# Patient Record
Sex: Male | Born: 1970 | Race: White | Hispanic: No | Marital: Single | Smoking: Current every day smoker
Health system: Southern US, Community
[De-identification: ages and names within clinical notes are randomized; demographics above are authoritative.]

## PROBLEM LIST (undated history)

## (undated) HISTORY — PX: ACHILLES TENDON REPAIR: SUR1153

---

## 2016-04-10 ENCOUNTER — Encounter (HOSPITAL_COMMUNITY): Payer: Self-pay | Admitting: Emergency Medicine

## 2016-04-10 ENCOUNTER — Emergency Department (HOSPITAL_COMMUNITY)
Admission: EM | Admit: 2016-04-10 | Discharge: 2016-04-10 | Disposition: A | Discharge status: Home / Self Care | Payer: Self-pay | Attending: Emergency Medicine | Admitting: Emergency Medicine

## 2016-04-10 DIAGNOSIS — M7662 Achilles tendinitis, left leg: Secondary | ICD-10-CM | POA: Insufficient documentation

## 2016-04-10 DIAGNOSIS — F17210 Nicotine dependence, cigarettes, uncomplicated: Secondary | ICD-10-CM | POA: Insufficient documentation

## 2016-04-10 MED ORDER — MELOXICAM 15 MG PO TABS: 15.0000 mg | ORAL_TABLET | Freq: Every day | ORAL | 0 refills | Status: AC

## 2016-04-10 MED ORDER — HYDROCODONE-ACETAMINOPHEN 5-325 MG PO TABS
1.0000 | ORAL_TABLET | Freq: Once | ORAL | Status: AC
  Administered 2016-04-10: 1 via ORAL
  Filled 2016-04-10: qty 1

## 2016-04-10 MED ORDER — NAPROXEN 250 MG PO TABS
500.0000 mg | ORAL_TABLET | Freq: Once | ORAL | Status: AC
  Administered 2016-04-10: 500 mg via ORAL
  Filled 2016-04-10: qty 2

## 2016-04-10 MED ORDER — CYCLOBENZAPRINE HCL 10 MG PO TABS: 10.0000 mg | ORAL_TABLET | Freq: Two times a day (BID) | ORAL | 0 refills | Status: AC | PRN

## 2016-04-10 MED ORDER — PREDNISONE 10 MG (21) PO TBPK: 10.0000 mg | ORAL_TABLET | Freq: Every day | ORAL | 0 refills | Status: AC

## 2016-04-10 MED ORDER — HYDROCODONE-ACETAMINOPHEN 5-325 MG PO TABS: 1.0000 | ORAL_TABLET | Freq: Four times a day (QID) | ORAL | 0 refills | Status: AC | PRN

## 2016-04-10 NOTE — ED Notes (Signed)
Pt is in stable condition upon d/c and ambulates from ED. 

## 2016-04-10 NOTE — ED Triage Notes (Signed)
Pt from home with c/o left ankle pain starting this past week.  Pt reports hx of previous injury.  NAD, A&O.

## 2016-04-10 NOTE — Discharge Instructions (Signed)
Below are instructions for performing the Achilles Tendon Taping. Use athletic or kinesiology tape for the best support.    Take the Mobic daily for the next 7 days. Make sure the Achilles tendon has support at all times, especially while ambulating. Follow-up with orthopedics.

## 2016-04-10 NOTE — ED Provider Notes (Signed)
[facility]-EMERGENCY DEPT Provider Note   CSN: [id number] Arrival date & time: 04/10/16  1434  By signing my name below, I, [name], attest that this documentation has been prepared under the direction and in the presence of non-physician practitioner, Harolyn Rutherford, PA-C. Electronically Signed: [name], Scribe. 04/10/2016. 2:56 PM.  History   Chief Complaint Chief Complaint  Patient presents with  . Ankle Pain   The history is provided by the patient. No language interpreter was used.   HPI Comments: Henry Finley is a 45 y.o. male who presents to the Emergency Department complaining of constant moderate left ankle pain that started last week. He states that his pain may be related to a past injury that caused an Achilles tendon rupture requiring surgery 3-4 years ago, and reports no new injury. Pt describes the pain as constant, throbbing, radiating to his left posterior ankle, 8/10 in severity, exacerbated by ambulation, relieved by an ankle brace, and unrelieved by OTC pain medication. He denies any neuro deficits or any other complaints.   History reviewed. No pertinent past medical history.  There are no active problems to display for this patient.   Past Surgical History:  Procedure Laterality Date  . ACHILLES TENDON REPAIR     left       Home Medications    Prior to Admission medications   Medication Sig Start Date End Date Taking? Authorizing Provider  cyclobenzaprine (FLEXERIL) 10 MG tablet Take 1 tablet (10 mg total) by mouth 2 (two) times daily as needed for muscle spasms. 04/10/16   Shawn C Joy, PA-C  HYDROcodone-acetaminophen (NORCO/VICODIN) 5-325 MG tablet Take 1-2 tablets by mouth every 6 (six) hours as needed for severe pain. 04/10/16   Shawn C Joy, PA-C  meloxicam (MOBIC) 15 MG tablet Take 1 tablet (15 mg total) by mouth daily. 04/10/16   Shawn C Joy, PA-C  predniSONE (STERAPRED UNI-PAK 21 TAB) 10 MG (21) TBPK tablet Take 1 tablet (10 mg total) by mouth daily.  Take 6 tabs by mouth daily  for 2 day, then 5 tabs for 2 days, then 4 tabs for 2 days, then 3 tabs for 2 days, 2 tabs for 2 days, then 1 tab by mouth daily for 2 days 04/10/16   Anselm Pancoast, PA-C    Family History History reviewed. No pertinent family history.  Social History Social History  Substance Use Topics  . Smoking status: Current Every Day Smoker    Packs/day: 0.50    Types: Cigarettes  . Smokeless tobacco: Never Used  . Alcohol use No     Allergies   Review of patient's allergies indicates no known allergies.   Review of Systems Review of Systems  Musculoskeletal: Positive for arthralgias and myalgias.  Neurological: Negative for weakness and numbness.     Physical Exam Updated Vital Signs BP 123/87 (BP Location: Left Arm)   Pulse 71   Temp 98.3 F (36.8 C) (Oral)   Resp 16   SpO2 97%   Physical Exam  Constitutional: He appears well-developed and well-nourished. No distress.  HENT:  Head: Normocephalic and atraumatic.  Eyes: Conjunctivae are normal.  Neck: Neck supple.  Cardiovascular: Normal rate and regular rhythm.   Pulmonary/Chest: Effort normal.  Musculoskeletal:  TTP along left achilles tendon, with full ROM in the left ankle. Thompson's test shows the Achilles tendon is intact. Strength is 5/5. No sensory deficits.   Neurological: He is alert.  Skin: Skin is warm and dry. He is not diaphoretic.  Psychiatric: He has a normal mood and affect. His behavior is normal.  Nursing note and vitals reviewed.    ED Treatments / Results  DIAGNOSTIC STUDIES: Oxygen Saturation is 97% on RA, normal by my interpretation.    COORDINATION OF CARE: 3:00 PM- Pt advised of plan for treatment and pt agrees.  Labs (all labs ordered are listed, but only abnormal results are displayed) Labs Reviewed - No data to display  EKG  EKG Interpretation None       Radiology No results found.  Procedures Procedures (including critical care time)  Orders  Placed This Encounter  Procedures  . Apply ace wrap    Medications Ordered in ED Medications  naproxen (NAPROSYN) tablet 500 mg (not administered)  HYDROcodone-acetaminophen (NORCO/VICODIN) 5-325 MG per tablet 1 tablet (not administered)     Initial Impression / Assessment and Plan / ED Course  I have reviewed the triage vital signs and the nursing notes.  Pertinent labs & imaging results that were available during my care of the patient were reviewed by me and considered in my medical decision making (see chart for details).  Clinical Course    Henry Finley presents with left posterior ankle pain. Suspect Achilles tendinitis. Patient to follow-up with orthopedics. Resources given. The patient was given instructions for home care as well as return precautions. Patient voices understanding of these instructions, accepts the plan, and is comfortable with discharge.  Final Clinical Impressions(s) / ED Diagnoses   Final diagnoses:  Achilles tendinitis, left leg    New Prescriptions New Prescriptions   CYCLOBENZAPRINE (FLEXERIL) 10 MG TABLET    Take 1 tablet (10 mg total) by mouth 2 (two) times daily as needed for muscle spasms.   HYDROCODONE-ACETAMINOPHEN (NORCO/VICODIN) 5-325 MG TABLET    Take 1-2 tablets by mouth every 6 (six) hours as needed for severe pain.   MELOXICAM (MOBIC) 15 MG TABLET    Take 1 tablet (15 mg total) by mouth daily.   PREDNISONE (STERAPRED UNI-PAK 21 TAB) 10 MG (21) TBPK TABLET    Take 1 tablet (10 mg total) by mouth daily. Take 6 tabs by mouth daily  for 2 day, then 5 tabs for 2 days, then 4 tabs for 2 days, then 3 tabs for 2 days, 2 tabs for 2 days, then 1 tab by mouth daily for 2 days   I personally performed the services described in this documentation, which was scribed in my presence. The recorded information has been reviewed and is accurate.    Anselm PancoastShawn C Joy, PA-C 04/10/16 1538    Geoffery Lyonsouglas Delo, MD 04/11/16 [phone]

## 2016-08-19 ENCOUNTER — Encounter (HOSPITAL_COMMUNITY): Payer: Self-pay | Admitting: Emergency Medicine

## 2016-08-19 ENCOUNTER — Emergency Department (HOSPITAL_COMMUNITY): Payer: Self-pay

## 2016-08-19 ENCOUNTER — Emergency Department (HOSPITAL_COMMUNITY)
Admission: EM | Admit: 2016-08-19 | Discharge: 2016-08-19 | Disposition: A | Discharge status: Home / Self Care | Payer: Self-pay | Attending: Emergency Medicine | Admitting: Emergency Medicine

## 2016-08-19 DIAGNOSIS — F17210 Nicotine dependence, cigarettes, uncomplicated: Secondary | ICD-10-CM | POA: Insufficient documentation

## 2016-08-19 DIAGNOSIS — R509 Fever, unspecified: Secondary | ICD-10-CM

## 2016-08-19 DIAGNOSIS — R05 Cough: Secondary | ICD-10-CM | POA: Insufficient documentation

## 2016-08-19 DIAGNOSIS — R0981 Nasal congestion: Secondary | ICD-10-CM

## 2016-08-19 DIAGNOSIS — R52 Pain, unspecified: Secondary | ICD-10-CM

## 2016-08-19 DIAGNOSIS — R059 Cough, unspecified: Secondary | ICD-10-CM

## 2016-08-19 MED ORDER — GUAIFENESIN 100 MG/5ML PO LIQD: 100.0000 mg | ORAL | 0 refills | Status: AC | PRN

## 2016-08-19 MED ORDER — GUAIFENESIN 100 MG/5ML PO LIQD: 100.0000 mg | ORAL | 0 refills | Status: DC | PRN

## 2016-08-19 MED ORDER — FLUTICASONE PROPIONATE 50 MCG/ACT NA SUSP: 2.0000 | Freq: Every day | NASAL | 2 refills | Status: DC

## 2016-08-19 MED ORDER — FLUTICASONE PROPIONATE 50 MCG/ACT NA SUSP: 2.0000 | Freq: Every day | NASAL | 2 refills | Status: AC

## 2016-08-19 MED ORDER — OSELTAMIVIR PHOSPHATE 75 MG PO CAPS: 75.0000 mg | ORAL_CAPSULE | Freq: Two times a day (BID) | ORAL | 0 refills | Status: AC

## 2016-08-19 MED ORDER — OSELTAMIVIR PHOSPHATE 75 MG PO CAPS: 75.0000 mg | ORAL_CAPSULE | Freq: Two times a day (BID) | ORAL | 0 refills | Status: DC

## 2016-08-19 NOTE — ED Triage Notes (Signed)
Cough and back pain since sat , sputum has some blood streaks in it

## 2016-08-19 NOTE — Discharge Instructions (Signed)
Your chest x-ray was negative today. Your symptoms are most likely due to an upper respiratory infection.  Possibly the flu or bronchitis. You have been prescribed Tamiflu for influenza, please take this as prescribed. You have also been prescribed Flonase nasal spray for nasal congestion and Robitussin for cough. Make sure he stay well-hydrated and drink enough water and that good-quality arrest. Monitoring her symptoms as they should start to improve within 5-7 days. Return to the emergency department if you have difficulty breathing, shortness of breath, chest pain, fevers and or if her symptoms worsen.

## 2016-08-19 NOTE — ED Provider Notes (Signed)
[facility] Provider Note    By signing my name below, I, Earmon Phoenix, attest that this documentation has been prepared under the direction and in the presence of Sharen Heck, PA-C. Electronically Signed: Earmon Phoenix, ED Scribe. 08/19/16. 1:52 PM.    History   Chief Complaint Chief Complaint  Patient presents with  . Cough  . Back Pain   The history is provided by the patient and medical records. No language interpreter was used.    Henry Finley is a 46 y.o. male who presents to the Emergency Department complaining of productive cough of phlegm with streaks of blood that began two days ago. He reports associated generalized body aches, sneezing, nasal congestion, shortness of breath, back and chest soreness with coughing. He reports subjective fever last night. He has taken OTC medication (nyquil) for symptomatic relief. He states he has been eating normally and drinking lots of fluids. Coughing increases his body aches, back pain and chest soreness. He denies alleviating factors. He denies abdominal pain, nausea, vomiting, diarrhea, constipation, exertional CP, otalgia, sore throat. He denies h/o allergies. He denies trauma, injury or fall. He is a smoker stating he smokes about 0.25 PPD. No recent international travel or exposure to TB. No exertional chest pain or chest tightness.  History reviewed. No pertinent past medical history.  There are no active problems to display for this patient.   Past Surgical History:  Procedure Laterality Date  . ACHILLES TENDON REPAIR     left       Home Medications    Prior to Admission medications   Medication Sig Start Date End Date Taking? Authorizing Provider  cyclobenzaprine (FLEXERIL) 10 MG tablet Take 1 tablet (10 mg total) by mouth 2 (two) times daily as needed for muscle spasms. 04/10/16   Shawn C Joy, PA-C  fluticasone (FLONASE) 50 MCG/ACT nasal spray Place 2 sprays into both nostrils daily. 08/19/16   Liberty Handy, PA-C  guaiFENesin (ROBITUSSIN) 100 MG/5ML liquid Take 5-10 mLs (100-200 mg total) by mouth every 4 (four) hours as needed for cough. 08/19/16   Liberty Handy, PA-C  HYDROcodone-acetaminophen (NORCO/VICODIN) 5-325 MG tablet Take 1-2 tablets by mouth every 6 (six) hours as needed for severe pain. 04/10/16   Shawn C Joy, PA-C  meloxicam (MOBIC) 15 MG tablet Take 1 tablet (15 mg total) by mouth daily. 04/10/16   Shawn C Joy, PA-C  oseltamivir (TAMIFLU) 75 MG capsule Take 1 capsule (75 mg total) by mouth every 12 (twelve) hours. 08/19/16   Liberty Handy, PA-C  predniSONE (STERAPRED UNI-PAK 21 TAB) 10 MG (21) TBPK tablet Take 1 tablet (10 mg total) by mouth daily. Take 6 tabs by mouth daily  for 2 day, then 5 tabs for 2 days, then 4 tabs for 2 days, then 3 tabs for 2 days, 2 tabs for 2 days, then 1 tab by mouth daily for 2 days 04/10/16   Anselm Pancoast, PA-C    Family History No family history on file.  Social History Social History  Substance Use Topics  . Smoking status: Current Every Day Smoker    Packs/day: 0.50    Types: Cigarettes  . Smokeless tobacco: Never Used  . Alcohol use No     Allergies   Patient has no known allergies.   Review of Systems Review of Systems  Constitutional: Positive for fever. Negative for chills.  HENT: Positive for congestion, postnasal drip and rhinorrhea. Negative for ear pain, sinus pain, sinus pressure and  sore throat.   Eyes: Negative for visual disturbance.  Respiratory: Positive for cough and shortness of breath. Negative for chest tightness.   Cardiovascular: Negative for chest pain.  Gastrointestinal: Negative for abdominal pain, constipation, diarrhea, nausea and vomiting.  Genitourinary: Negative for decreased urine volume and difficulty urinating.  Musculoskeletal: Positive for myalgias. Negative for arthralgias and joint swelling.  Skin: Negative for rash.  Neurological: Negative for dizziness, light-headedness and headaches.      Physical Exam Updated Vital Signs BP 119/92 (BP Location: Left Arm)   Pulse 78   Temp 99.2 F (37.3 C) (Oral)   Resp 16   Ht 6' (1.829 m)   Wt 156 lb 7 oz (71 kg)   SpO2 100%   BMI 21.22 kg/m   Physical Exam  Constitutional: He is oriented to person, place, and time. He appears well-developed and well-nourished. No distress.  HENT:  Head: Normocephalic and atraumatic.  Nose: Nose normal.  Mouth/Throat: Oropharynx is clear and moist. No oropharyngeal exudate.  Post nasal drip noted. Erythematous and mildly edematous inferior turbinates bilaterally (L>R).  Moist mucous membranes.  Oropharynx pink without exudates. Tonsils without erythema, edema or exudates.  Uvula midline.  Eyes: Conjunctivae and EOM are normal. Pupils are equal, round, and reactive to light. No scleral icterus.  Neck: Normal range of motion. Neck supple. No JVD present. No tracheal deviation present.  Cardiovascular: Normal rate, regular rhythm, normal heart sounds and intact distal pulses.   No murmur heard. Pulmonary/Chest: Effort normal and breath sounds normal. He has no wheezes.  Abdominal: Soft. He exhibits no distension. There is no tenderness.  Musculoskeletal: Normal range of motion. He exhibits no deformity.  Lymphadenopathy:    He has no cervical adenopathy.  Neurological: He is alert and oriented to person, place, and time.  Skin: Skin is warm and dry. Capillary refill takes less than 2 seconds.  Psychiatric: He has a normal mood and affect. His behavior is normal. Judgment and thought content normal.  Nursing note and vitals reviewed.    ED Treatments / Results  DIAGNOSTIC STUDIES: Oxygen Saturation is 100% on RA, normal by my interpretation.   COORDINATION OF CARE: 1:35 PM- Will treat symptomatically. Pt verbalizes understanding and agrees to plan.  Medications - No data to display  Labs (all labs ordered are listed, but only abnormal results are displayed) Labs Reviewed - No  data to display  EKG  EKG Interpretation None       Radiology Dg Chest 2 View  Result Date: 08/19/2016 CLINICAL DATA:  Shortness of breath, chest pain EXAM: CHEST  2 VIEW COMPARISON:  None. FINDINGS: The heart size and mediastinal contours are within normal limits. Both lungs are clear. The visualized skeletal structures are unremarkable. IMPRESSION: No active cardiopulmonary disease. Electronically Signed   By: Elige Ko   On: 08/19/2016 12:13    Procedures Procedures (including critical care time)  Medications Ordered in ED Medications - No data to display   Initial Impression / Assessment and Plan / ED Course  I have reviewed the triage vital signs and the nursing notes.  Pertinent labs & imaging results that were available during my care of the patient were reviewed by me and considered in my medical decision making (see chart for details).  Clinical Course    Patient with symptoms most consistent with influenza, possibly acute viral bronchitis.  Chest x-ray negative. Doubt PNA or TB. I think blood streaked sputum likely from inflammatory process and unlikely from TB. Vitals are  reassuring, afebrile. No signs of dehydration, tolerating PO. Lungs are clear. Will prescribe Tamiflu, flonase and Robitussin. Patient will be discharged with instructions to orally hydrate, rest, and use over-the-counter medications such as anti-inflammatories ibuprofen and Aleve for muscle aches and Tylenol for fever. Patient given work note. Pt given instructions for symptom resolution within 7-10 days.  ED return instructions given. Pt verbalized understanding and agreeable to discharge plan.  I personally performed the services described in this documentation, which was scribed in my presence. The recorded information has been reviewed and is accurate.   Final Clinical Impressions(s) / ED Diagnoses   Final diagnoses:  Cough  Body aches  Fever, unspecified fever cause  Nasal congestion     New Prescriptions Current Discharge Medication List    START taking these medications   Details  fluticasone (FLONASE) 50 MCG/ACT nasal spray Place 2 sprays into both nostrils daily. Qty: 16 g, Refills: 2    guaiFENesin (ROBITUSSIN) 100 MG/5ML liquid Take 5-10 mLs (100-200 mg total) by mouth every 4 (four) hours as needed for cough. Qty: 60 mL, Refills: 0    oseltamivir (TAMIFLU) 75 MG capsule Take 1 capsule (75 mg total) by mouth every 12 (twelve) hours. Qty: 10 capsule, Refills: 0         Liberty HandyClaudia J Tavish Gettis, PA-C 08/19/16 1411    Vanetta MuldersScott Zackowski, MD 08/20/16 [phone]

## 2016-08-19 NOTE — ED Notes (Signed)
Declined W/C at D/C and was escorted to lobby by RN. 

## 2016-09-17 ENCOUNTER — Emergency Department (HOSPITAL_COMMUNITY)
Admission: EM | Admit: 2016-09-17 | Discharge: 2016-09-17 | Disposition: A | Discharge status: Home / Self Care | Payer: Self-pay | Attending: Emergency Medicine | Admitting: Emergency Medicine

## 2016-09-17 ENCOUNTER — Encounter (HOSPITAL_COMMUNITY): Payer: Self-pay | Admitting: Emergency Medicine

## 2016-09-17 DIAGNOSIS — F17210 Nicotine dependence, cigarettes, uncomplicated: Secondary | ICD-10-CM | POA: Insufficient documentation

## 2016-09-17 DIAGNOSIS — K047 Periapical abscess without sinus: Secondary | ICD-10-CM | POA: Insufficient documentation

## 2016-09-17 MED ORDER — PENICILLIN V POTASSIUM 500 MG PO TABS: 500.0000 mg | ORAL_TABLET | Freq: Three times a day (TID) | ORAL | 0 refills | Status: AC

## 2016-09-17 MED ORDER — PENICILLIN V POTASSIUM 250 MG PO TABS
250.0000 mg | ORAL_TABLET | Freq: Once | ORAL | Status: AC
  Administered 2016-09-17: 250 mg via ORAL
  Filled 2016-09-17: qty 1

## 2016-09-17 MED ORDER — KETOROLAC TROMETHAMINE 60 MG/2ML IM SOLN
60.0000 mg | Freq: Once | INTRAMUSCULAR | Status: AC
  Administered 2016-09-17: 60 mg via INTRAMUSCULAR
  Filled 2016-09-17: qty 2

## 2016-09-17 MED ORDER — HYDROCODONE-ACETAMINOPHEN 5-325 MG PO TABS
2.0000 | ORAL_TABLET | Freq: Once | ORAL | Status: AC
  Administered 2016-09-17: 2 via ORAL
  Filled 2016-09-17: qty 2

## 2016-09-17 MED ORDER — IBUPROFEN 400 MG PO TABS: 400.0000 mg | ORAL_TABLET | Freq: Four times a day (QID) | ORAL | 0 refills | Status: AC

## 2016-09-17 NOTE — ED Provider Notes (Signed)
[facility] Provider Note   CSN: [id number] Arrival date & time: 09/17/16  1315     History   Chief Complaint No chief complaint on file.   HPI Henry Finley is a 46 y.o. male.   Dental Pain   This is a recurrent problem. The current episode started 6 to 12 hours ago. The problem occurs constantly. The problem has been gradually worsening. The pain is moderate. He has tried nothing for the symptoms. The treatment provided no relief.    History reviewed. No pertinent past medical history.  There are no active problems to display for this patient.   Past Surgical History:  Procedure Laterality Date  . ACHILLES TENDON REPAIR     left       Home Medications    Prior to Admission medications   Medication Sig Start Date End Date Taking? Authorizing Provider  cyclobenzaprine (FLEXERIL) 10 MG tablet Take 1 tablet (10 mg total) by mouth 2 (two) times daily as needed for muscle spasms. 04/10/16   Shawn C Joy, PA-C  fluticasone (FLONASE) 50 MCG/ACT nasal spray Place 2 sprays into both nostrils daily. 08/19/16   Liberty Handylaudia J Gibbons, PA-C  guaiFENesin (ROBITUSSIN) 100 MG/5ML liquid Take 5-10 mLs (100-200 mg total) by mouth every 4 (four) hours as needed for cough. 08/19/16   Liberty Handylaudia J Gibbons, PA-C  HYDROcodone-acetaminophen (NORCO/VICODIN) 5-325 MG tablet Take 1-2 tablets by mouth every 6 (six) hours as needed for severe pain. 04/10/16   Shawn C Joy, PA-C  ibuprofen (ADVIL,MOTRIN) 400 MG tablet Take 1 tablet (400 mg total) by mouth 4 (four) times daily. 09/17/16   Marily MemosJason Clark Clowdus, MD  meloxicam (MOBIC) 15 MG tablet Take 1 tablet (15 mg total) by mouth daily. 04/10/16   Shawn C Joy, PA-C  oseltamivir (TAMIFLU) 75 MG capsule Take 1 capsule (75 mg total) by mouth every 12 (twelve) hours. 08/19/16   Liberty Handylaudia J Gibbons, PA-C  penicillin v potassium (VEETID) 500 MG tablet Take 1 tablet (500 mg total) by mouth 3 (three) times daily. 09/17/16   Marily MemosJason Buren Havey, MD  predniSONE (STERAPRED UNI-PAK 21 TAB) 10  MG (21) TBPK tablet Take 1 tablet (10 mg total) by mouth daily. Take 6 tabs by mouth daily  for 2 day, then 5 tabs for 2 days, then 4 tabs for 2 days, then 3 tabs for 2 days, 2 tabs for 2 days, then 1 tab by mouth daily for 2 days 04/10/16   Anselm PancoastShawn C Joy, PA-C    Family History No family history on file.  Social History Social History  Substance Use Topics  . Smoking status: Current Every Day Smoker    Packs/day: 0.50    Types: Cigarettes  . Smokeless tobacco: Never Used  . Alcohol use No     Allergies   Patient has no known allergies.   Review of Systems Review of Systems  Constitutional: Negative for chills.  Eyes: Negative for pain.  Cardiovascular: Negative for chest pain.  All other systems reviewed and are negative.    Physical Exam Updated Vital Signs BP 132/92   Pulse 81   Temp 99.5 F (37.5 C) (Oral)   Resp 18   SpO2 97%   Physical Exam  Constitutional: He is oriented to person, place, and time. He appears well-developed and well-nourished.  HENT:  Head: Normocephalic and atraumatic.  Mouth/Throat:    Eyes: Conjunctivae and EOM are normal.  Neck: Normal range of motion.  Cardiovascular: Normal rate.   Pulmonary/Chest: Effort normal. No  respiratory distress.  Abdominal: Soft. He exhibits no distension. There is no tenderness.  Musculoskeletal: Normal range of motion. He exhibits no edema or deformity.  Neurological: He is alert and oriented to person, place, and time.  Skin: Skin is warm and dry.  Nursing note and vitals reviewed.    ED Treatments / Results  Labs (all labs ordered are listed, but only abnormal results are displayed) Labs Reviewed - No data to display  EKG  EKG Interpretation None       Radiology No results found.  Procedures Procedures (including critical care time)  Medications Ordered in ED Medications  penicillin v potassium (VEETID) tablet 250 mg (not administered)  ketorolac (TORADOL) injection 60 mg (not  administered)  HYDROcodone-acetaminophen (NORCO/VICODIN) 5-325 MG per tablet 2 tablet (not administered)     Initial Impression / Assessment and Plan / ED Course  I have reviewed the triage vital signs and the nursing notes.  Pertinent labs & imaging results that were available during my care of the patient were reviewed by me and considered in my medical decision making (see chart for details).    Dental infection of left upper 3rd molar that has been broken off. Will start pcn/ibuprofen. Has dental follow up for Friday. No breathing/swallowing difficulties to suggest complications.   Final Clinical Impressions(s) / ED Diagnoses   Final diagnoses:  Dental infection    New Prescriptions New Prescriptions   IBUPROFEN (ADVIL,MOTRIN) 400 MG TABLET    Take 1 tablet (400 mg total) by mouth 4 (four) times daily.   PENICILLIN V POTASSIUM (VEETID) 500 MG TABLET    Take 1 tablet (500 mg total) by mouth 3 (three) times daily.     Marily Memos, MD 09/17/16 [phone]

## 2016-09-17 NOTE — ED Triage Notes (Signed)
Patient here with severe left upper dental/gum pain x 1 week, no relief with otc meds

## 2018-07-01 IMAGING — DX DG CHEST 2V
2 series · 2 of 2 positions shown · non-contrast
Comparison: None.

CLINICAL DATA: Shortness of breath, chest pain

EXAM:
CHEST  2 VIEW

[chest pa]
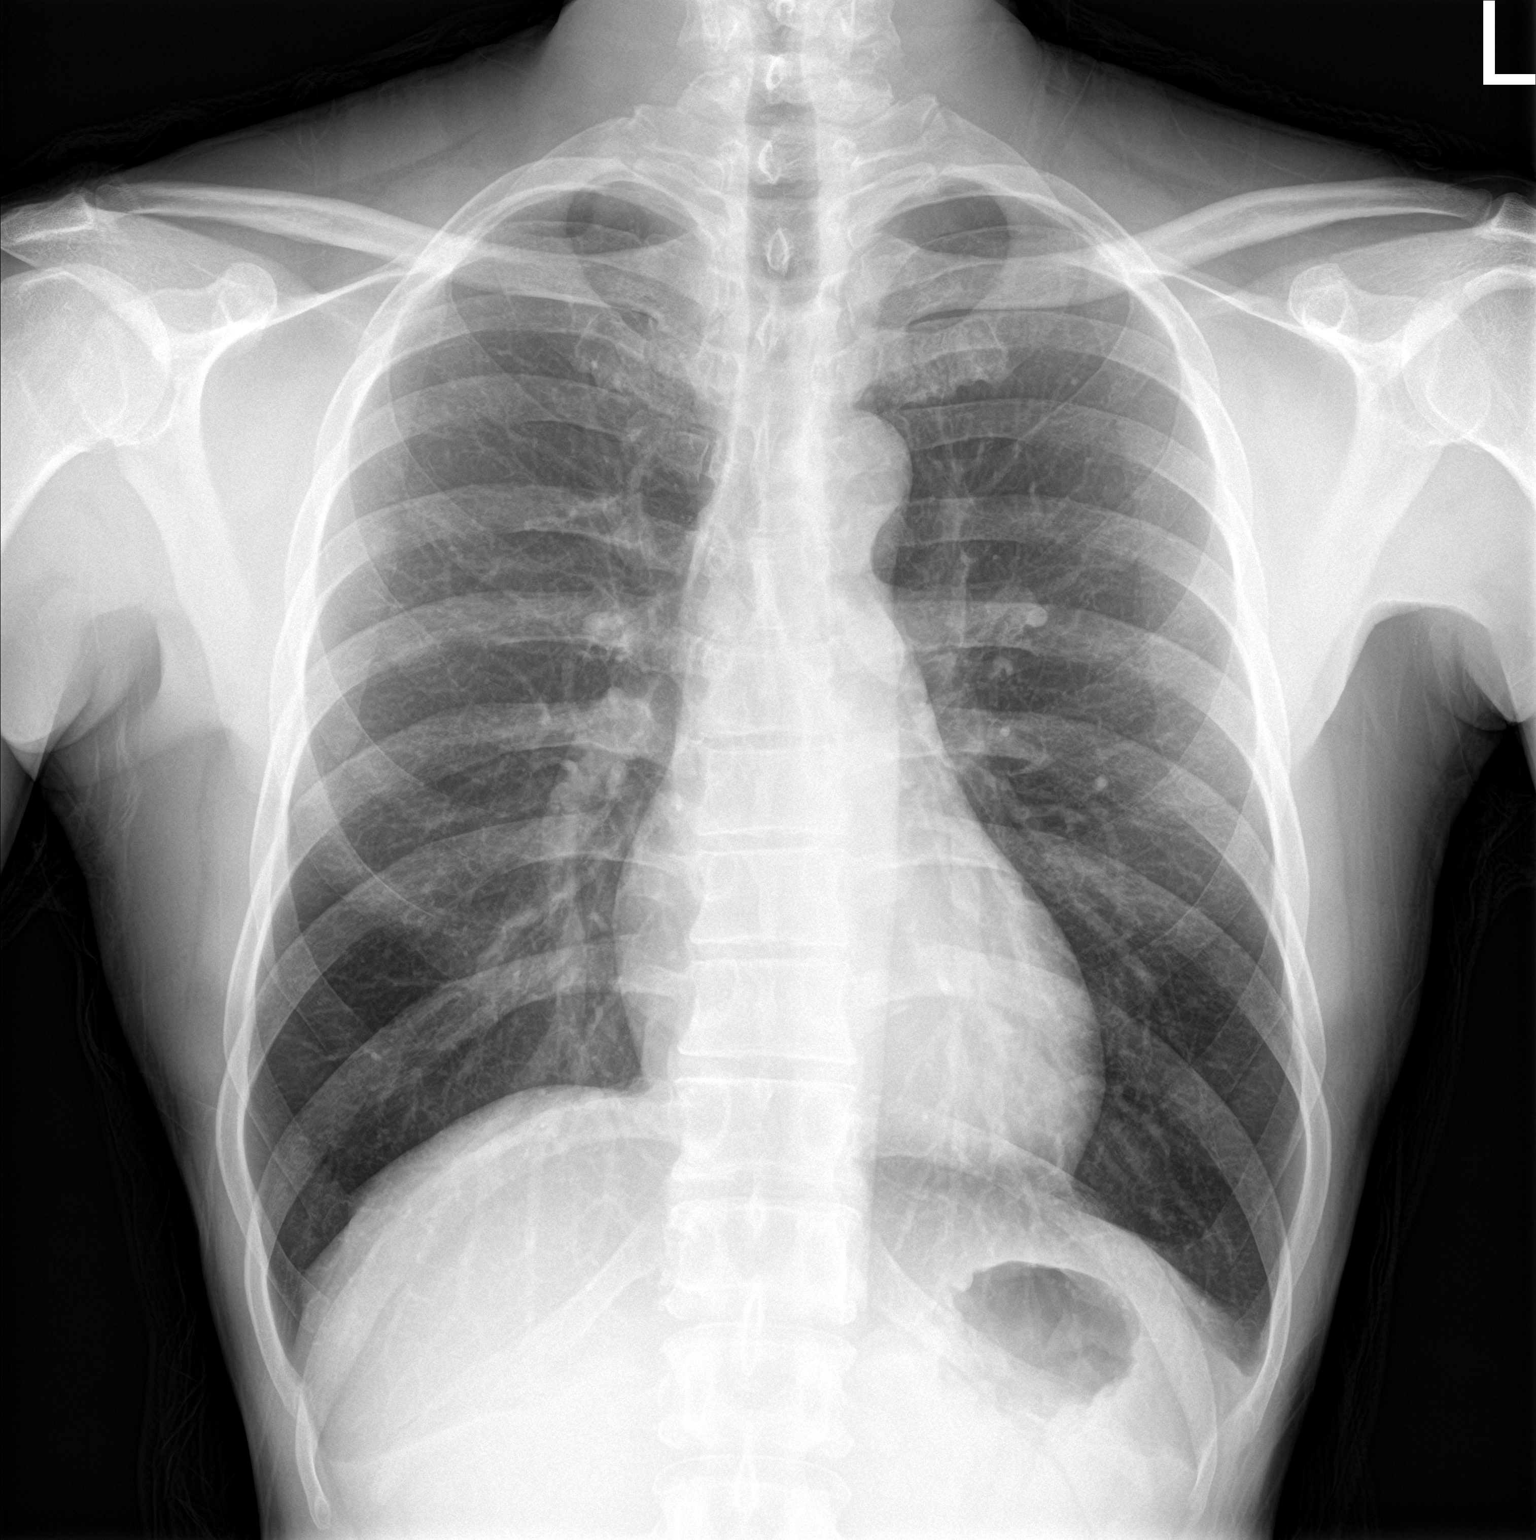

[chest lat]
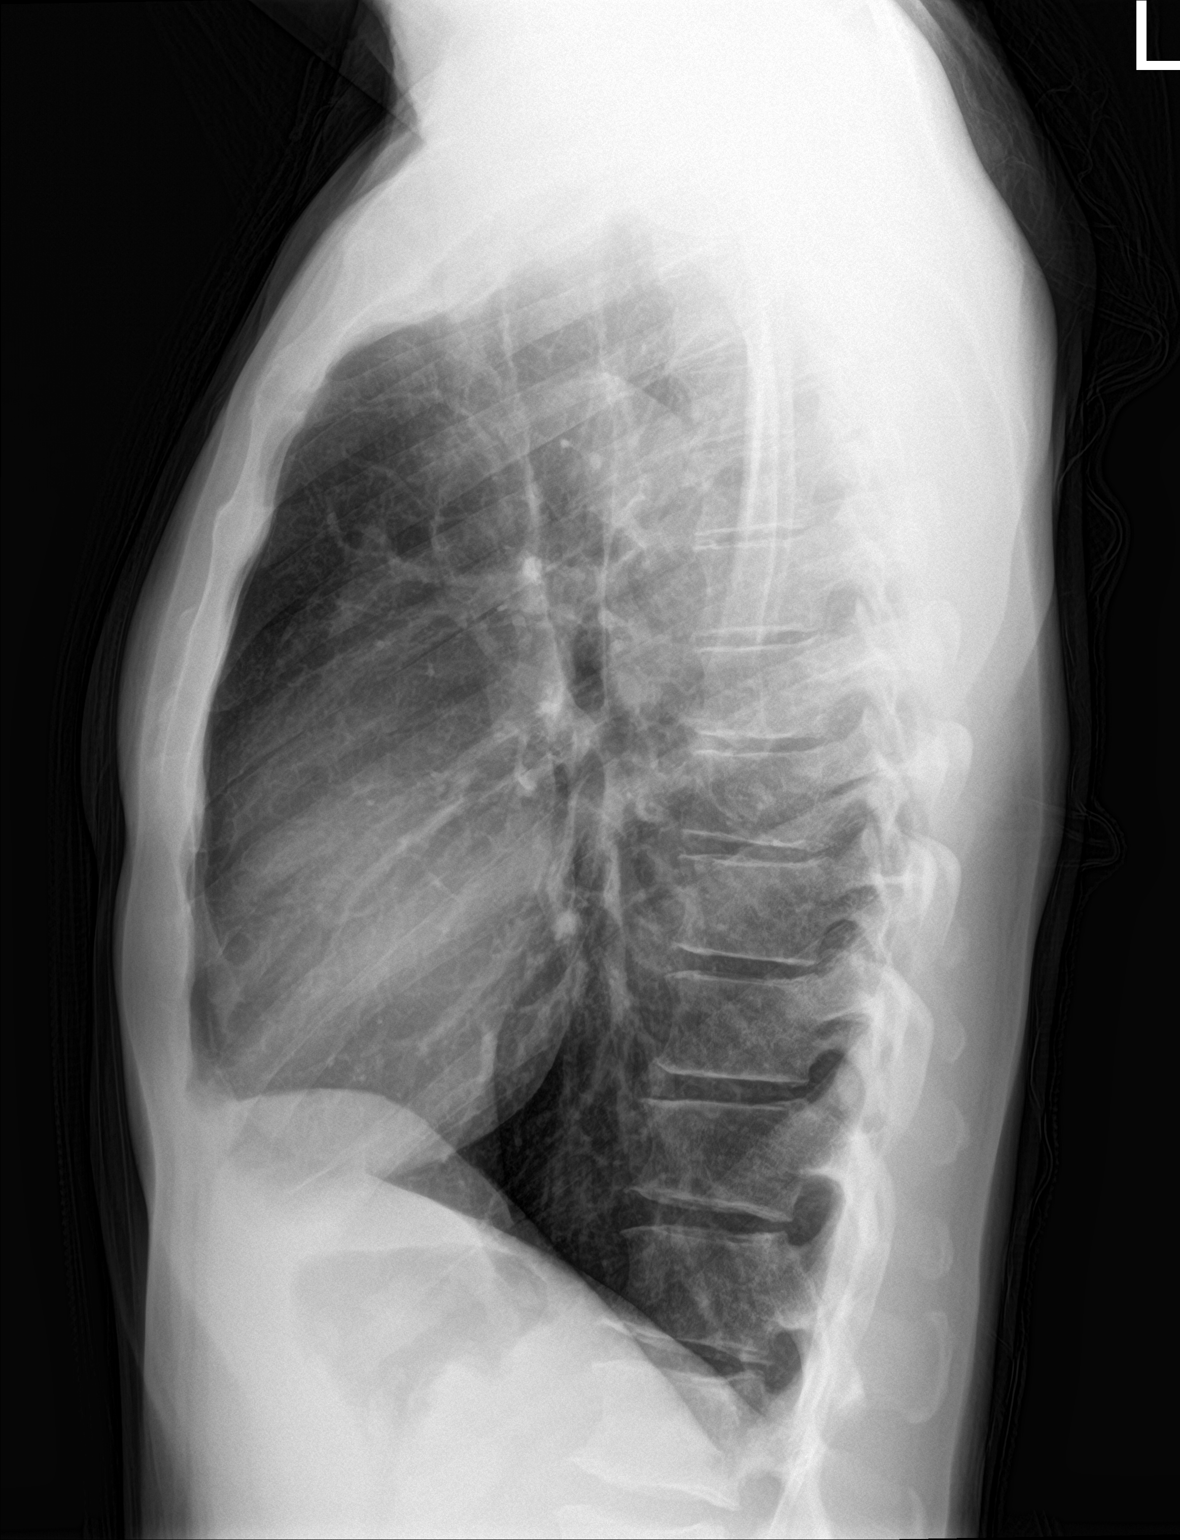

[2 of 2 positions shown; findings below may reference images not displayed]

FINDINGS: The heart size and mediastinal contours are within normal limits.
Both lungs are clear. The visualized skeletal structures are
unremarkable.
IMPRESSION: No active cardiopulmonary disease.
